# Patient Record
Sex: Female | Born: 1960 | Race: White | Hispanic: No | Smoking: Never smoker
Health system: Southern US, Community
[De-identification: ages and names within clinical notes are randomized; demographics above are authoritative.]

## PROBLEM LIST (undated history)

## (undated) DIAGNOSIS — E785 Hyperlipidemia, unspecified: Secondary | ICD-10-CM

## (undated) HISTORY — DX: Hyperlipidemia, unspecified: E78.5

---

## 1990-12-28 HISTORY — PX: CHOLECYSTECTOMY: SHX55

## 1993-12-28 HISTORY — PX: HERNIA REPAIR: SHX51

## 2004-10-27 ENCOUNTER — Ambulatory Visit: Payer: Self-pay | Admitting: Family Medicine

## 2010-10-02 ENCOUNTER — Inpatient Hospital Stay (HOSPITAL_COMMUNITY)
Admission: EM | Admit: 2010-10-02 | Discharge: 2010-10-05 | Payer: Self-pay | Source: Home / Self Care | Admitting: Emergency Medicine

## 2010-10-28 ENCOUNTER — Ambulatory Visit: Payer: Self-pay | Admitting: Family Medicine

## 2011-03-12 LAB — HEPATIC FUNCTION PANEL
ALT: 30 U/L (ref 0–35)
AST: 31 U/L (ref 0–37)
Bilirubin, Direct: 0.1 mg/dL (ref 0.0–0.3)
Total Bilirubin: 0.5 mg/dL (ref 0.3–1.2)

## 2011-03-12 LAB — CBC
HCT: 30.9 % — ABNORMAL LOW (ref 36.0–46.0)
HCT: 31.4 % — ABNORMAL LOW (ref 36.0–46.0)
HCT: 38.9 % (ref 36.0–46.0)
Hemoglobin: 10.1 g/dL — ABNORMAL LOW (ref 12.0–15.0)
Hemoglobin: 13.2 g/dL (ref 12.0–15.0)
MCH: 28.1 pg (ref 26.0–34.0)
MCHC: 32.7 g/dL (ref 30.0–36.0)
MCV: 82.2 fL (ref 78.0–100.0)
MCV: 84.6 fL (ref 78.0–100.0)
MCV: 85.8 fL (ref 78.0–100.0)
RBC: 3.71 MIL/uL — ABNORMAL LOW (ref 3.87–5.11)
RBC: 4.73 MIL/uL (ref 3.87–5.11)
WBC: 12.4 10*3/uL — ABNORMAL HIGH (ref 4.0–10.5)
WBC: 8 10*3/uL (ref 4.0–10.5)

## 2011-03-12 LAB — APTT: aPTT: 26 seconds (ref 24–37)

## 2011-03-12 LAB — DIFFERENTIAL
Basophils Relative: 0 % (ref 0–1)
Eosinophils Relative: 2 % (ref 0–5)
Lymphs Abs: 3.6 10*3/uL (ref 0.7–4.0)
Monocytes Relative: 4 % (ref 3–12)
Neutro Abs: 8.1 10*3/uL — ABNORMAL HIGH (ref 1.7–7.7)

## 2011-03-12 LAB — POCT I-STAT, CHEM 8
Chloride: 110 mEq/L (ref 96–112)
Creatinine, Ser: 0.9 mg/dL (ref 0.4–1.2)
Glucose, Bld: 154 mg/dL — ABNORMAL HIGH (ref 70–99)
Potassium: 3.2 mEq/L — ABNORMAL LOW (ref 3.5–5.1)
TCO2: 19 mmol/L (ref 0–100)

## 2011-03-12 LAB — URINALYSIS, ROUTINE W REFLEX MICROSCOPIC
Bilirubin Urine: NEGATIVE
Glucose, UA: NEGATIVE mg/dL
Hgb urine dipstick: NEGATIVE
Protein, ur: NEGATIVE mg/dL
Specific Gravity, Urine: 1.01 (ref 1.005–1.030)

## 2011-03-12 LAB — TYPE AND SCREEN: Antibody Screen: NEGATIVE

## 2011-03-12 LAB — ABO/RH: ABO/RH(D): O POS

## 2011-03-12 LAB — MRSA PCR SCREENING: MRSA by PCR: NEGATIVE

## 2011-03-12 LAB — BASIC METABOLIC PANEL
Chloride: 113 mEq/L — ABNORMAL HIGH (ref 96–112)
GFR calc Af Amer: >60 mL/min (ref >60)
Potassium: 3.9 mEq/L (ref 3.5–5.1)

## 2011-12-16 ENCOUNTER — Ambulatory Visit: Payer: Self-pay | Admitting: Family Medicine

## 2014-01-15 ENCOUNTER — Ambulatory Visit: Payer: Self-pay | Admitting: Family Medicine

## 2014-06-15 ENCOUNTER — Ambulatory Visit: Payer: Self-pay | Admitting: Gastroenterology

## 2014-06-18 LAB — PATHOLOGY REPORT

## 2017-08-17 NOTE — Progress Notes (Signed)
08/18/2017 12:18 PM   Samantha Tran 1961-07-06 [id number]  Referring provider: Jerl Mina, MD [address] [facility] [place], [place] [address]  Chief Complaint  Patient presents with  . New Patient (Initial Visit)    urinary incontinece referred by Dr. Delia Chimes     HPI Patient is a 56 -year-old Caucasian female who is referred to [place] by, Dr. Jerl Mina, for urinary incontinence.  Patient states that she has had urinary incontinence for four years.  Patient has incontinence with SUI, changing position and UI.  She is leakage all day long and all night long.  She has been doing Kegel exercises at home without benefit.  Tried timed voids q 20 minutes.    Her incontinence volume is moderate to large.   She is wearing two Poise pads/depends daily.    She is having associated urinary frequency 6-7, urgency and nocturia x 3-4.  She does not have a history of urinary tract infections, STI's or injury to the bladder.   She denies  dysuria, gross hematuria, suprapubic pain, back pain, abdominal pain or flank pain.  She has not had any recent fevers, chills, nausea or vomiting.   She has not had any recent fevers, chills, nausea or vomiting.   She does not have a history of nephrolithiasis, GU surgery or GU trauma.   She is post menopausal.   She denies constipation and/or diarrhea.   She has not had any recent imaging studies.    She is drinking several bottles of water daily.   She is drinking one caffeinated beverages daily.  She is not drinking alcoholic beverages daily.  Her PVR was 0 mL.    Reviewed referral notes.    PMH: No past medical history on file.  Surgical History: Past Surgical History:  Procedure Laterality Date  . CHOLECYSTECTOMY  1992  . HERNIA REPAIR  1995    Home Medications:  Allergies as of 08/18/2017      Reactions   Fluoxetine Other (See Comments)   "made me cry all the time" [Onset: 09/2010]   Oxycodone Other (See Comments)     N/V & Dizziness      Medication List       Accurate as of 08/18/17 11:59 PM. Always use your most recent med list.          sertraline 25 MG tablet Commonly known as:  ZOLOFT Take by mouth.   simvastatin 10 MG tablet Commonly known as:  ZOCOR Take by mouth.   topiramate 100 MG tablet Commonly known as:  TOPAMAX Take by mouth.            Discharge Care Instructions        Start     Ordered   08/18/17 0000  BLADDER SCAN AMB NON-IMAGING     08/18/17 1502   08/18/17 0000  Ambulatory referral to Physical Therapy    Comments:  Please evaluate for pelvic floor strengthening.   08/18/17 1604      Allergies:  Allergies  Allergen Reactions  . Fluoxetine Other (See Comments)    "made me cry all the time" [Onset: 09/2010]  . Oxycodone Other (See Comments)    N/V & Dizziness    Family History: Family History  Problem Relation Age of Onset  . Kidney cancer Neg Hx   . Bladder Cancer Neg Hx     Social History:  reports that she has never smoked. She has never used smokeless tobacco. She  reports that she does not drink alcohol or use drugs.  ROS: UROLOGY Frequent Urination?: Yes Hard to postpone urination?: Yes Burning/pain with urination?: No Get up at night to urinate?: Yes Leakage of urine?: Yes Urine stream starts and stops?: No Trouble starting stream?: No Do you have to strain to urinate?: No Blood in urine?: No Urinary tract infection?: No Sexually transmitted disease?: No Injury to kidneys or bladder?: No Painful intercourse?: No Weak stream?: No Currently pregnant?: No Vaginal bleeding?: No Last menstrual period?: n  Gastrointestinal Nausea?: No Vomiting?: No Indigestion/heartburn?: No Diarrhea?: No Constipation?: No  Constitutional Fever: No Night sweats?: No Weight loss?: No Fatigue?: No  Skin Skin rash/lesions?: No Itching?: No  Eyes Blurred vision?: No Double vision?: No  Ears/Nose/Throat Sore throat?: No Sinus  problems?: No  Hematologic/Lymphatic Swollen glands?: No Easy bruising?: No  Cardiovascular Leg swelling?: Yes Chest pain?: No  Respiratory Cough?: No Shortness of breath?: No  Endocrine Excessive thirst?: No  Musculoskeletal Back pain?: No Joint pain?: No  Neurological Headaches?: Yes Dizziness?: No  Psychologic Depression?: No Anxiety?: Yes  Physical Exam: BP 127/77   Pulse 89   Ht 5\' 1"  (1.549 m)   Wt 224 lb 14.4 oz (102 kg)   BMI 42.49 kg/m   Constitutional: Well nourished. Alert and oriented, No acute distress. HEENT: [place] AT, moist mucus membranes. Trachea midline, no masses. Cardiovascular: No clubbing, cyanosis, or edema. Respiratory: Normal respiratory effort, no increased work of breathing. GI: Abdomen is soft, non tender, non distended, no abdominal masses. Liver and spleen not palpable.  No hernias appreciated.  Stool sample for occult testing is not indicated.   GU: No CVA tenderness.  No bladder fullness or masses.  Atrophic external genitalia, normal pubic hair distribution, no lesions.  Normal urethral meatus, no lesions, no prolapse, no discharge.   No urethral masses, tenderness and/or tenderness. No bladder fullness, tenderness or masses. Normal vagina mucosa, good estrogen effect, no discharge, no lesions, good pelvic support, Grade I cystocele. No rectocele noted.  No cervical motion tenderness.  Uterus is freely mobile and non-fixed.  No adnexal/parametria masses or tenderness noted.  Anus and perineum are without rashes or lesions.    Skin: No rashes, bruises or suspicious lesions. Lymph: No cervical or inguinal adenopathy. Neurologic: Grossly intact, no focal deficits, moving all 4 extremities. Psychiatric: Normal mood and affect.  Laboratory Data: Creatinine 0.6 in 06/2017  I have reviewed the labs.   Pertinent Imaging: Results for MEL, MALCOM (MRN [id number]) as of 08/26/2017 12:21  Ref. Range 08/18/2017 15:10  Scan Result Unknown 0      Assessment & Plan:    1. Mixed Incontinence  - offered behavioral therapies, bladder training, bladder control strategies and pelvic floor muscle training - patient would like a referral to PT  - offered medical therapy with anticholinergic therapy or beta-3 adrenergic receptor agonist and the potential side effects of each therapy - given Myrbetriq 25 mg, # 28 samples; I have advised the patient of the side effects of Myrbetriq, such as: elevation in BP, urinary retention and/or HA.  - offered refer to gynecology for a pessary fitting - patient deferred  - RTC in 3 weeks for PVR and symptom recheck   2. Cystocele  - referred to PT   Return in about 3 weeks (around 09/08/2017) for PVR and OAB questionnaire.  These notes generated with voice recognition software. I apologize for typographical errors.  Michiel Cowboy, PA-C  [facility] [address]  [place], [place] [address] [phone]

## 2017-08-18 ENCOUNTER — Encounter: Payer: Self-pay | Admitting: Urology

## 2017-08-18 ENCOUNTER — Ambulatory Visit (INDEPENDENT_AMBULATORY_CARE_PROVIDER_SITE_OTHER): Payer: BC Managed Care – PPO | Admitting: Urology

## 2017-08-18 VITALS — BP 127/77 | HR 89 | Ht 61.0 in | Wt 224.9 lb

## 2017-08-18 DIAGNOSIS — N3946 Mixed incontinence: Secondary | ICD-10-CM

## 2017-08-18 DIAGNOSIS — N8111 Cystocele, midline: Secondary | ICD-10-CM

## 2017-08-18 DIAGNOSIS — N39498 Other specified urinary incontinence: Secondary | ICD-10-CM | POA: Diagnosis not present

## 2017-08-18 LAB — BLADDER SCAN AMB NON-IMAGING: Scan Result: 0

## 2017-09-08 ENCOUNTER — Ambulatory Visit (INDEPENDENT_AMBULATORY_CARE_PROVIDER_SITE_OTHER): Payer: BC Managed Care – PPO | Admitting: Urology

## 2017-09-08 ENCOUNTER — Encounter: Payer: Self-pay | Admitting: Urology

## 2017-09-08 ENCOUNTER — Telehealth: Payer: Self-pay | Admitting: Urology

## 2017-09-08 VITALS — BP 160/78 | HR 82 | Ht 61.0 in | Wt 225.7 lb

## 2017-09-08 DIAGNOSIS — N393 Stress incontinence (female) (male): Secondary | ICD-10-CM

## 2017-09-08 DIAGNOSIS — N8111 Cystocele, midline: Secondary | ICD-10-CM

## 2017-09-08 DIAGNOSIS — N3946 Mixed incontinence: Secondary | ICD-10-CM | POA: Diagnosis not present

## 2017-09-08 LAB — BLADDER SCAN AMB NON-IMAGING: Scan Result: 0

## 2017-09-08 NOTE — Progress Notes (Signed)
09/08/2017 2:31 PM   Samantha Tran 29-Sep-1961 [id number]  Referring provider: Jerl Mina, MD [address] [facility] [place], [place] [address]  Chief Complaint  Patient presents with  . Follow-up    3 weeks Stress Incontinence    HPI: 56 yo WF who presents today for a 3 week follow up after a trial of Myrbetriq 25 mg for incontinence.  Background history Patient is a 65 -year-old Caucasian female who is referred to [place] by, Dr. Jerl Mina, for urinary incontinence.  Patient states that she has had urinary incontinence for four years.  Patient has incontinence with SUI, changing position and UI.  She is leakage all day long and all night long.  She has been doing Kegel exercises at home without benefit.  Tried timed voids q 20 minutes.  Her incontinence volume is moderate to large.   She is wearing two Poise pads/depends daily.   She is having associated urinary frequency 6-7, urgency and nocturia x 3-4.  She does not have a history of urinary tract infections, STI's or injury to the bladder.  She denies  dysuria, gross hematuria, suprapubic pain, back pain, abdominal pain or flank pain.  She has not had any recent fevers, chills, nausea or vomiting.  She has not had any recent fevers, chills, nausea or vomiting.  She does not have a history of nephrolithiasis, GU surgery or GU trauma.  She is post menopausal.   She denies constipation and/or diarrhea.  She has not had any recent imaging studies.  She is drinking several bottles of water daily.   She is drinking one caffeinated beverages daily.  She is not drinking alcoholic beverages daily.  Her PVR was 0 mL.    Today, she is experiencing urgency x 0-3, frequency x 8 or more (worse), is restricting fluids to avoid visits to the restroom, is engaging in toilet mapping, incontinence x 8 or more and nocturia x 4-7 (worse).    She is not experiencing dysuria, gross hematuria or suprapubic pain. She is not having fevers,  chills, nausea or vomiting.   She states the Myrbetriq made a difference.  She has notice less urgency.  PVR is 0 mL.       PMH: No past medical history on file.  Surgical History: Past Surgical History:  Procedure Laterality Date  . CHOLECYSTECTOMY  1992  . HERNIA REPAIR  1995    Home Medications:  Allergies as of 09/08/2017      Reactions   Fluoxetine Other (See Comments)   "made me cry all the time" [Onset: 09/2010]   Oxycodone Other (See Comments)   N/V & Dizziness      Medication List       Accurate as of 09/08/17 11:59 PM. Always use your most recent med list.          chlorpheniramine-HYDROcodone 10-8 MG/5ML Suer Commonly known as:  TUSSIONEX Take by mouth.   levofloxacin 500 MG tablet Commonly known as:  LEVAQUIN Take by mouth.   sertraline 25 MG tablet Commonly known as:  ZOLOFT Take by mouth.   simvastatin 10 MG tablet Commonly known as:  ZOCOR Take by mouth.   topiramate 100 MG tablet Commonly known as:  TOPAMAX Take by mouth.            Discharge Care Instructions        Start     Ordered   09/08/17 0000  BLADDER SCAN AMB NON-IMAGING  09/08/17 1538      Allergies:  Allergies  Allergen Reactions  . Fluoxetine Other (See Comments)    "made me cry all the time" [Onset: 09/2010]  . Oxycodone Other (See Comments)    N/V & Dizziness    Family History: Family History  Problem Relation Age of Onset  . Kidney cancer Neg Hx   . Bladder Cancer Neg Hx     Social History:  reports that she has never smoked. She has never used smokeless tobacco. She reports that she does not drink alcohol or use drugs.  ROS: UROLOGY Frequent Urination?: No Hard to postpone urination?: No Burning/pain with urination?: No Get up at night to urinate?: Yes Leakage of urine?: Yes Urine stream starts and stops?: No Trouble starting stream?: No Do you have to strain to urinate?: No Blood in urine?: No Urinary tract infection?: No Sexually  transmitted disease?: No Injury to kidneys or bladder?: No Painful intercourse?: No Weak stream?: No Currently pregnant?: No Vaginal bleeding?: No Last menstrual period?: n  Gastrointestinal Nausea?: No Vomiting?: No Indigestion/heartburn?: No Diarrhea?: No Constipation?: No  Constitutional Fever: No Night sweats?: No Weight loss?: No Fatigue?: No  Skin Skin rash/lesions?: No Itching?: No  Eyes Blurred vision?: No Double vision?: No  Ears/Nose/Throat Sore throat?: No Sinus problems?: No  Hematologic/Lymphatic Swollen glands?: No Easy bruising?: No  Cardiovascular Leg swelling?: No Chest pain?: No  Respiratory Cough?: Yes Shortness of breath?: Yes  Endocrine Excessive thirst?: No  Musculoskeletal Back pain?: No Joint pain?: No  Neurological Headaches?: No Dizziness?: No  Psychologic Depression?: No Anxiety?: No  Physical Exam: BP (!) 160/78   Pulse 82   Ht 5\' 1"  (1.549 m)   Wt 225 lb 11.2 oz (102.4 kg)   BMI 42.65 kg/m   Constitutional: Well nourished. Alert and oriented, No acute distress. HEENT: [place] AT, moist mucus membranes. Trachea midline, no masses. Cardiovascular: No clubbing, cyanosis, or edema. Respiratory: Normal respiratory effort, no increased work of breathing. Skin: No rashes, bruises or suspicious lesions. Lymph: No cervical or inguinal adenopathy. Neurologic: Grossly intact, no focal deficits, moving all 4 extremities. Psychiatric: Normal mood and affect.  Laboratory Data: Creatinine 0.6 in 06/2017  I have reviewed the labs.   Pertinent Imaging: Results for Samantha Tran, Lattie H (MRN [id number]) as of 09/10/2017 14:33  Ref. Range 09/08/2017 15:50  Scan Result Unknown 0      Assessment & Plan:    1. Mixed Incontinence  - offered behavioral therapies, bladder training, bladder control strategies and pelvic floor muscle training - patient would like a referral to PT - has an appointment on 10/13/2017  - would like to  increase the Myrbetriq 50 mg to see if it is more effective   - RTC in 3 weeks for PVR and OAB questionnaire  2. Cystocele  - referred to PT   Return in about 3 weeks (around 09/29/2017) for PVR and OAB questionnaire.  These notes generated with voice recognition software. I apologize for typographical errors.  Michiel CowboySHANNON Karie Skowron, PA-C  [facility] [address], [address] [place], [place] [address] [phone]

## 2017-09-08 NOTE — Telephone Encounter (Signed)
Please send my note to Dr. Hedrick 

## 2017-09-13 NOTE — Telephone Encounter (Signed)
Notes faxed ° °Samantha Tran ° °

## 2017-09-27 NOTE — Progress Notes (Signed)
09/29/2017 6:34 PM   Bary Richard [birth year] [id number]  Referring provider: Jerl Mina, MD [address] [facility] [place], [place] [address]  Chief Complaint  Patient presents with  . Follow-up    3 weeks mixed incoontinence / Cystocele    HPI: 56 yo WF who presents today for a 3 week follow up after a trial of Myrbetriq 50 mg for incontinence.  Background history Patient is a 49 -year-old Caucasian female who is referred to [place] by, Dr. Jerl Mina, for urinary incontinence.  Patient states that she has had urinary incontinence for four years.  Patient has incontinence with SUI, changing position and UI.  She is leakage all day long and all night long.  She has been doing Kegel exercises at home without benefit.  Tried timed voids q 20 minutes.  Her incontinence volume is moderate to large.   She is wearing two Poise pads/depends daily.   She is having associated urinary frequency 6-7, urgency and nocturia x 3-4.  She does not have a history of urinary tract infections, STI's or injury to the bladder.  She denies  dysuria, gross hematuria, suprapubic pain, back pain, abdominal pain or flank pain.  She has not had any recent fevers, chills, nausea or vomiting.  She has not had any recent fevers, chills, nausea or vomiting.  She does not have a history of nephrolithiasis, GU surgery or GU trauma.  She is post menopausal.   She denies constipation and/or diarrhea.  She has not had any recent imaging studies.  She is drinking several bottles of water daily.   She is drinking one caffeinated beverages daily.  She is not drinking alcoholic beverages daily.  Her PVR was 0 mL.    At her visit three weeks ago, we increased her Myrbetriq from 25 mg to 50 mg daily.    Today, she is experiencing urgency x 8 or more (worse), frequency x 8 or more (stable), is not restricting fluids to avoid visits to the restroom, is engaging in toilet mapping, incontinence x 8 or more (stable) and  nocturia x 4-7 (stable).    She is not experiencing dysuria, gross hematuria or suprapubic pain. She is not having fevers, chills, nausea or vomiting.   She states the Myrbetriq "wore off".   PVR is 0 mL.  BP is 132/79.      She has her appointment scheduled for PT and a couple weeks. I suggested that she get the POISE OTC pessary in the meantime.  I also gave her Toviaz 8 mg daily samples #28.       PMH: Past Medical History:  Diagnosis Date  . HLD (hyperlipidemia)     Surgical History: Past Surgical History:  Procedure Laterality Date  . CHOLECYSTECTOMY  1992  . HERNIA REPAIR  1995    Home Medications:  Allergies as of 09/29/2017      Reactions   Fluoxetine Other (See Comments)   "made me cry all the time" [Onset: 09/2010]   Oxycodone Other (See Comments)   N/V & Dizziness      Medication List       Accurate as of 09/29/17 11:59 PM. Always use your most recent med list.          chlorpheniramine-HYDROcodone 10-8 MG/5ML Suer Commonly known as:  TUSSIONEX Take by mouth.   levofloxacin 500 MG tablet Commonly known as:  LEVAQUIN Take by mouth.   MYRBETRIQ 50 MG Tb24 tablet Generic drug:  mirabegron ER Take 50 mg by mouth daily.   sertraline 25 MG tablet Commonly known as:  ZOLOFT Take by mouth.   simvastatin 10 MG tablet Commonly known as:  ZOCOR Take by mouth.   topiramate 100 MG tablet Commonly known as:  TOPAMAX Take by mouth.       Allergies:  Allergies  Allergen Reactions  . Fluoxetine Other (See Comments)    "made me cry all the time" [Onset: 09/2010]  . Oxycodone Other (See Comments)    N/V & Dizziness    Family History: Family History  Problem Relation Age of Onset  . Kidney cancer Neg Hx   . Bladder Cancer Neg Hx     Social History:  reports that she has never smoked. She has never used smokeless tobacco. She reports that she does not drink alcohol or use drugs.  ROS: UROLOGY Frequent Urination?: Yes Hard to postpone urination?:  No Burning/pain with urination?: No Get up at night to urinate?: Yes Leakage of urine?: Yes Urine stream starts and stops?: No Trouble starting stream?: No Do you have to strain to urinate?: No Blood in urine?: No Urinary tract infection?: No Sexually transmitted disease?: No Injury to kidneys or bladder?: No Painful intercourse?: No Weak stream?: No Currently pregnant?: No Vaginal bleeding?: No Last menstrual period?: n  Gastrointestinal Nausea?: No Vomiting?: No Indigestion/heartburn?: No Diarrhea?: No Constipation?: No  Constitutional Fever: No Night sweats?: No Weight loss?: No Fatigue?: No  Skin Skin rash/lesions?: No Itching?: No  Eyes Blurred vision?: No Double vision?: No  Ears/Nose/Throat Sore throat?: No Sinus problems?: No  Hematologic/Lymphatic Swollen glands?: No Easy bruising?: No  Cardiovascular Leg swelling?: No Chest pain?: No  Respiratory Cough?: No Shortness of breath?: No  Endocrine Excessive thirst?: No  Musculoskeletal Back pain?: No Joint pain?: No  Neurological Headaches?: No Dizziness?: No  Psychologic Depression?: No Anxiety?: No  Physical Exam: BP 132/79   Pulse 80   Ht  (1.549 m)   Wt 227 lb 12.8 oz (103.3 kg)   BMI 43.04 kg/m   Constitutional: Well nourished. Alert and oriented, No acute distress. HEENT: [place] AT, moist mucus membranes. Trachea midline, no masses. Cardiovascular: No clubbing, cyanosis, or edema. Respiratory: Normal respiratory effort, no increased work of breathing. Skin: No rashes, bruises or suspicious lesions. Lymph: No cervical or inguinal adenopathy. Neurologic: Grossly intact, no focal deficits, moving all 4 extremities. Psychiatric: Normal mood and affect.  Laboratory Data: Creatinine 0.6 in 06/2017  I have reviewed the labs.   Pertinent Imaging: Results for JAZZ, BIDDY (MRN [id number]) as of 09/29/2017 16:01  Ref. Range 09/29/2017 15:56  Scan Result Unknown 14     Assessment & Plan:    1. Mixed Incontinence  - offered behavioral therapies, bladder training, bladder control strategies and pelvic floor muscle training - patient would like a referral to PT - has an appointment on 10/13/2017  - failed Myrbetriq 50  - given Toviaz sample  - RTC after completes PT  2. Cystocele  - referred to PT  - try OTC pessary   Return for patient to call when she completes PT.  These notes generated with voice recognition software. I apologize for typographical errors.  Michiel Cowboy, PA-C  [facility] [address] [place], [place] [address] [phone]

## 2017-09-29 ENCOUNTER — Encounter: Payer: Self-pay | Admitting: Urology

## 2017-09-29 ENCOUNTER — Ambulatory Visit (INDEPENDENT_AMBULATORY_CARE_PROVIDER_SITE_OTHER): Payer: BC Managed Care – PPO | Admitting: Urology

## 2017-09-29 VITALS — BP 132/79 | HR 80 | Ht 61.0 in | Wt 227.8 lb

## 2017-09-29 DIAGNOSIS — N3946 Mixed incontinence: Secondary | ICD-10-CM

## 2017-09-29 DIAGNOSIS — E785 Hyperlipidemia, unspecified: Secondary | ICD-10-CM | POA: Insufficient documentation

## 2017-09-29 DIAGNOSIS — N8111 Cystocele, midline: Secondary | ICD-10-CM

## 2017-09-29 LAB — BLADDER SCAN AMB NON-IMAGING: SCAN RESULT: 14

## 2017-10-08 ENCOUNTER — Encounter: Payer: Self-pay | Admitting: Urology

## 2017-10-13 ENCOUNTER — Ambulatory Visit: Payer: BC Managed Care – PPO | Attending: Urology | Admitting: Physical Therapy

## 2017-10-13 ENCOUNTER — Encounter: Payer: Self-pay | Admitting: Physical Therapy

## 2017-10-13 DIAGNOSIS — R29898 Other symptoms and signs involving the musculoskeletal system: Secondary | ICD-10-CM | POA: Diagnosis present

## 2017-10-13 DIAGNOSIS — R278 Other lack of coordination: Secondary | ICD-10-CM | POA: Insufficient documentation

## 2017-10-13 DIAGNOSIS — M791 Myalgia, unspecified site: Secondary | ICD-10-CM

## 2017-10-13 NOTE — Patient Instructions (Signed)
Handout on   Deep core 1-2  Body mechanics with household chores and lifting

## 2017-10-15 NOTE — Therapy (Signed)
[facility] MAIN [facility] SERVICES [address] [place], [place], [address] Phone: [phone]   Fax:  [phone]  Physical Therapy Evaluation  Patient Details  Name: Samantha Tran MRN: [id number] Date of Birth: [birth year] Referring Provider: Michiel Cowboy   Encounter Date: 10/13/2017    Past Medical History:  Diagnosis Date  . HLD (hyperlipidemia)     Past Surgical History:  Procedure Laterality Date  . CHOLECYSTECTOMY  1992  . HERNIA REPAIR  1995   below belly button    There were no vitals filed for this visit.       Subjective Assessment - 10/15/17 0910    Subjective Pt reported one year ago, pt experienced leaking with coughing, sneezing, and laughing, leaning forward to sit up out of chair, standing up from a chair, out of bed. Pt has noticed it has gotten worse to the point of leakage constantly. Pt wears 2-3 pads / day.  denied fecal incontinence, pelvic pain, nor LBP.  Nocturia: 3-4x/ night which has also increased over the past year. Prior to this, pt was able to sleep all night without getting up. Pt has leakage even though she just went. Pt's urologist told her that she is not holding urine but it is just there. Occupation now as Science writer:  lifting  60 lb, standing for long periods, bending, pulling . 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 Umbilical hernia repair. Pt used to lift repeatedly 60 lb at her previous job.     Patient Stated Goals to strengthen             [facility] PT Assessment - 10/15/17 0911      Assessment   Medical Diagnosis SUI   Referring Provider Michiel Cowboy      Precautions   Precautions None     Restrictions   Weight Bearing Restrictions No     Balance Screen   Has the patient fallen in the past 6 months No     Coordination   Gross Motor Movements are Fluid and  Coordinated --  dyscoordination of pelvic floor mm, abdominal strain     Squat   Comments narrow BOS, knees over ankles      Other:   Other/ Comments simulated tasks: poor alignment and biomechanics housecleaning, vaccuuming, lifting and turning      Palpation   Spinal mobility increased spinl and upper trap mm tightness             Objective measurements completed on examination: See above findings.        Pelvic Floor Special Questions - 10/15/17 0931    Diastasis Recti neg    Pelvic Floor Internal Exam pt was explained details to the exam and pt consented verbally without contraindications    Exam Type Vaginal   Palpation increased tensions at levator ani B    Strength fair squeeze, definite lift          [facility] Adult PT Treatment/Exercise - 10/15/17 0932      Neuro Re-ed    Neuro Re-ed Details  see pt instructions     Manual Therapy   Internal Pelvic Floor thiele massage, STM at B levator ani                      PT Long Term Goals - 10/13/17 1816  PT LONG TERM GOAL #1   Title Pt will decrease her score on UDI-6 from54% to < 49% in order to improve pelvic floor function   Time 12   Period Weeks   Status New   Target Date 01/05/18     PT LONG TERM GOAL #2   Title Pt will report no leakage with getting up from the chair in order to particiapte in ADLs   Time 8   Period Weeks   Status New   Target Date 12/08/17     PT LONG TERM GOAL #3   Title Pt will demo no pelvic floor mm tensions across 2 visits in order to achieve proper pelvic floor lengthening / coordination in order to gain continence    Time 4   Period Weeks   Status New   Target Date 11/10/17     PT LONG TERM GOAL #4   Title Pt will demo IND with HEP   Time 10   Period Weeks   Status New   Target Date 10/13/17                Plan - 10/15/17 0957    Clinical Impression Statement Pt is a 56 yo female who c/o constant urinary leakage, nocturia frequency, and  incomplete emptying. These deficits impact her QOL and ADLs. Pt 's clinical presentations include increased spinal and upper trap tensions, poor body mechanics with functional activities, and overactivity and dyscoordination of pelvic floor mm. Following Tx today, pt demo'dincreased relaxation/ lengthening of pelvic floor mm with proper breathing coordination with pelvic floor. Pt also showed improved body mechanics for work related tasks to minimize strain on pelvic floor mm.      Clinical Presentation Evolving   Clinical Decision Making Low   Rehab Potential Good   PT Frequency 1x / week   PT Duration 12 weeks   PT Treatment/Interventions Therapeutic activities;Therapeutic exercise;Functional mobility training;Neuromuscular re-education;Manual techniques;Cryotherapy;Moist Heat;Splinting;Taping;Scar mobilization;Gait training;Stair training;Patient/family education   Consulted and Agree with Plan of Care Patient      Patient will benefit from skilled therapeutic intervention in order to improve the following deficits and impairments:  Postural dysfunction, Increased muscle spasms, Hypermobility, Improper body mechanics, Decreased coordination, Decreased mobility, Decreased safety awareness, Difficulty walking, Decreased endurance, Decreased activity tolerance, Decreased range of motion, Decreased strength, Hypomobility  Visit Diagnosis: Myalgia  Other lack of coordination  Other symptoms and signs involving the musculoskeletal system     Problem List Patient Active Problem List   Diagnosis Date Noted  . Hyperlipidemia, unspecified 09/29/2017    Mariane MastersYeung,Shin Yiing ,PT, DPT, E-RYT  10/15/2017, 9:58 AM  [facility] [address] [place], [place], [address] Phone: [phone]   Fax:  [phone]  Name: Samantha Tran MRN: [id number] Date of Birth: 11/21/1961

## 2017-10-19 ENCOUNTER — Ambulatory Visit: Payer: BC Managed Care – PPO | Admitting: Physical Therapy

## 2017-10-19 DIAGNOSIS — M791 Myalgia, unspecified site: Secondary | ICD-10-CM

## 2017-10-19 DIAGNOSIS — R29898 Other symptoms and signs involving the musculoskeletal system: Secondary | ICD-10-CM

## 2017-10-19 DIAGNOSIS — R278 Other lack of coordination: Secondary | ICD-10-CM

## 2017-10-19 NOTE — Therapy (Addendum)
[place] MAIN [facility] SERVICES [address] [place], [place], [address] Phone: [phone]   Fax:  [phone]  Physical Therapy Treatment  Patient Details  Name: Samantha Tran MRN: [id number] Date of Birth: 11-Feb-1961 Referring Provider: Zara Council   Encounter Date: 10/19/2017      PT End of Session - 10/19/17 1649    Visit Number 2   Number of Visits 12   Date for PT Re-Evaluation 01/05/18   PT Start Time 1600   PT Stop Time 1655   PT Time Calculation (min) 55 min   Activity Tolerance Patient tolerated treatment well;No increased pain   Behavior During Therapy WFL for tasks assessed/performed      Past Medical History:  Diagnosis Date  . HLD (hyperlipidemia)     Past Surgical History:  Procedure Laterality Date  . CHOLECYSTECTOMY  1992  . HERNIA REPAIR  1995   below belly button    There were no vitals filed for this visit.      Subjective Assessment - 10/19/17 1604    Subjective Pt reported her leakage is substantially better and she is waking up 2x/ night instead of 4x/ night.  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    Pertinent History 1 vaginal deliveries with LBP and excessive pushing. Umbilical hernia repair. Pt used to lift repeatedly 60 lb at her previous job.     Patient Stated Goals to strengthen             [facility] PT Assessment - 10/19/17 1639      Coordination   Gross Motor Movements are Fluid and Coordinated --  limited diaphragmatic exxcursion ( bra strap too tight)     Palpation   Spinal mobility significant tensions B upper trap and paraspinals at thoracic, hypomobility at T7 ( improved post Tx)                   Pelvic Floor Special Questions - 10/19/17 1638    Diastasis Recti neg    Pelvic Floor Internal Exam pt was explained details to the exam and pt consented verbally without contraindications    Exam Type Vaginal   Palpation no tensions at  levator ani B    Strength fair squeeze, definite lift  abdominal overuse           [facility] Adult PT Treatment/Exercise - 10/19/17 1640      Moist Heat Therapy   Number Minutes Moist Heat --  10 min with treatment    Moist Heat Location --  back     Manual Therapy   Manual therapy comments STM and MWM at paraspinals mm,  Grade III mob at T7    Internal Pelvic Floor facilitation of urogenital mm                 PT Education - 10/19/17 1648    Education provided Yes   Education Details HEP   Person(s) Educated Patient   Methods Explanation;Demonstration;Tactile cues;Verbal cues;Handout   Comprehension Returned demonstration;Verbalized understanding             PT Long Term Goals - 10/19/17 1606      PT LONG TERM GOAL #1   Title Pt will decrease her score on UDI-6 from54% to < 49% in order to improve pelvic floor function   Time 12   Period Weeks   Status On-going     PT LONG TERM GOAL #2  Title Pt will report no leakage with getting up from the chair in order to particiapte in ADLs   Time 8   Period Weeks   Status Partially Met     PT LONG TERM GOAL #3   Title Pt will demo no pelvic floor mm tensions across 2 visits in order to achieve proper pelvic floor lengthening / coordination in order to gain continence    Time 4   Period Weeks   Status Achieved     PT LONG TERM GOAL #4   Title Pt will demo IND with HEP   Time 10   Period Weeks   Status On-going     PT LONG TERM GOAL #5   Title Pt will demo proper pelvic floor ROM and closure with proper diaphragmatic excursion in order to minimize leakage   Time 12   Period Weeks   Status New     Additional Long Term Goals   Additional Long Term Goals Yes     PT LONG TERM GOAL #6   Title PT will demo decreased paraspinal mm tensions at thoracic spine in order to restore spinal and pelvic floor ROM for actitivities at home and work   Time 12   Period Weeks   Status New               Plan  - 10/19/17 1643    Clinical Impression Statement Pt is progressing well with report of decreased nocturia from 4x/ night to 2x/ night.  Pt demo'd no pelvic floor tensions B compared to last session. Pt required manual Tx to minimize thoracic mm tensions to elicit optimal diaphragmatic excursion and associated improved pelvic floor ROM.  Pt was also educated to don bra at a lower level and to use a bra extensor to provide room for lateral excursion of ribcage. Pt was also educated on mindfulness technique to induce relaxation of muscles. Pt reported feeling "peaceful" and demo'd less mm back tensions following Tx. PT continues to benefit from skilled PT.     Rehab Potential Good   PT Frequency 1x / week   PT Duration 12 weeks   PT Treatment/Interventions Therapeutic activities;Therapeutic exercise;Functional mobility training;Neuromuscular re-education;Manual techniques;Cryotherapy;Moist Heat;Splinting;Taping;Scar mobilization;Gait training;Stair training;Patient/family education   Consulted and Agree with Plan of Care Patient      Patient will benefit from skilled therapeutic intervention in order to improve the following deficits and impairments:  Postural dysfunction, Increased muscle spasms, Hypermobility, Improper body mechanics, Decreased coordination, Decreased mobility, Decreased safety awareness, Difficulty walking, Decreased endurance, Decreased activity tolerance, Decreased range of motion, Decreased strength, Hypomobility  Visit Diagnosis: Other lack of coordination  Other symptoms and signs involving the musculoskeletal system  Myalgia     Problem List Patient Active Problem List   Diagnosis Date Noted  . Hyperlipidemia, unspecified 09/29/2017    Jerl Mina ,PT, DPT, E-RYT  10/19/2017, 4:58 PM  [place] MAIN [facility] SERVICES [address] [place], [place], [address] Phone: [phone]   Fax:  [phone]  Name: Samantha Tran MRN: [id number] Date of Birth: 1961-11-17

## 2017-10-19 NOTE — Patient Instructions (Addendum)
Open book ( handout)   Deep core level 1-2 ( handout)   Body scan ( mindfulness technique)   To practice when coming home from work to decompress and relax muscles    Wear bra lower ( 1 inch above elbow) and fasten an extensor to provide more romm for ribcage expansion when breathing . This will help also to relax pelvic floor

## 2017-10-27 ENCOUNTER — Ambulatory Visit: Payer: BC Managed Care – PPO | Admitting: Physical Therapy

## 2017-10-27 DIAGNOSIS — M791 Myalgia, unspecified site: Secondary | ICD-10-CM | POA: Diagnosis not present

## 2017-10-27 DIAGNOSIS — R29898 Other symptoms and signs involving the musculoskeletal system: Secondary | ICD-10-CM

## 2017-10-27 DIAGNOSIS — R278 Other lack of coordination: Secondary | ICD-10-CM

## 2017-10-27 NOTE — Patient Instructions (Signed)
Inhale , relax pelvic floor and shoulders  Then lift       To release pelvic floor in standing position   _Figure-4 and then toe touch to the ground behind you along a diagonal

## 2017-10-27 NOTE — Therapy (Signed)
[place] MAIN [facility] SERVICES [address] [place], [place], [address] Phone: [phone]   Fax:  [phone]  Physical Therapy Treatment  Patient Details  Name: Samantha Tran MRN: [id number] Date of Birth: 08-Jul-1961 Referring Provider: Zara Council   Encounter Date: 10/27/2017      PT End of Session - 10/27/17 1617    Visit Number 3   Number of Visits 12   Date for PT Re-Evaluation 01/05/18   PT Start Time 1610   PT Stop Time 1700   PT Time Calculation (min) 50 min   Activity Tolerance Patient tolerated treatment well;No increased pain   Behavior During Therapy WFL for tasks assessed/performed      Past Medical History:  Diagnosis Date  . HLD (hyperlipidemia)     Past Surgical History:  Procedure Laterality Date  . CHOLECYSTECTOMY  1992  . HERNIA REPAIR  1995   below belly button    There were no vitals filed for this visit.      Subjective Assessment - 10/27/17 1616    Subjective pt noticed the triggers for urge: stress at work, unloading the trucks. Pt notices her legs are getting tighter with the exercises. pt has not had leakage with standing up.    Pertinent History 1 vaginal deliveries with LBP and excessive pushing. Umbilical hernia repair. Pt used to lift repeatedly 60 lb at her previous job.     Patient Stated Goals to strengthen                       Pelvic Floor Special Questions - 10/27/17 1641    Diastasis Recti neg    Pelvic Floor Internal Exam pt was explained details to the exam and pt consented verbally without contraindications    Exam Type Vaginal   Palpation tensions at 3rd layer B obt int/ iliococcygeus     Strength fair squeeze, definite lift  adductor overuse. ( improved lengthening post Tx0       Assessment: functional activity: upper trap overuse with inhalation when lifting        [facility] Adult PT Treatment/Exercise - 10/27/17 1641      Moist Heat Therapy   Moist  Heat Location --  back, perineal with pillow cases/towel. 6 min w/ exercise      Manual Therapy   Internal Pelvic Floor thiele massage at problem areas noted in assessment  B       Practiced lifting chair with proper breathing coordination to relax pelvicfloor           PT Education - 10/27/17 1640    Education provided Yes   Education Details HEP   Person(s) Educated Patient   Methods Explanation;Demonstration;Tactile cues;Verbal cues;Handout   Comprehension Returned demonstration;Verbalized understanding             PT Long Term Goals - 10/27/17 1621      PT LONG TERM GOAL #1   Title Pt will decrease her score on UDI-6 from54% to < 49% in order to improve pelvic floor function   Time 12   Period Weeks   Status On-going     PT LONG TERM GOAL #2   Title Pt will report no leakage with getting up from the chair in order to particiapte in ADLs   Time 8   Period Weeks   Status Partially Met     PT LONG TERM GOAL #3   Title Pt will demo no pelvic floor  mm tensions across 2 visits in order to achieve proper pelvic floor lengthening / coordination in order to gain continence    Time 4   Period Weeks   Status Achieved     PT LONG TERM GOAL #4   Title Pt will demo IND with HEP   Time 10   Period Weeks   Status On-going     PT LONG TERM GOAL #5   Title Pt will demo proper pelvic floor ROM and closure with proper diaphragmatic excursion in order to minimize leakage   Time 12   Period Weeks   Status On-going     PT LONG TERM GOAL #6   Title PT will demo decreased paraspinal mm tensions at thoracic spine in order to restore spinal and pelvic floor ROM for actitivities at home and work   Time 12   Period Weeks   Status Partially Met               Plan - 10/27/17 1642    Clinical Impression Statement Pt is progressing well with no more lekaage with sit to stand. Pt demo'd diminished pelvic floor mm tensions at deep layer today. Pt showed improved pelvic  floor lengthening. Discussed relaxation practices to minimze stress and overactivity of pelvic floor.  Initiated mini squat and lifting with proper pelvic floor lengthening. Pt demo'd proper coordination post Tx.  Pt continues to benefit from skilled PT    Rehab Potential Good   PT Frequency 1x / week   PT Duration 12 weeks   PT Treatment/Interventions Therapeutic activities;Therapeutic exercise;Functional mobility training;Neuromuscular re-education;Manual techniques;Cryotherapy;Moist Heat;Splinting;Taping;Scar mobilization;Gait training;Stair training;Patient/family education   Consulted and Agree with Plan of Care Patient      Patient will benefit from skilled therapeutic intervention in order to improve the following deficits and impairments:  Postural dysfunction, Increased muscle spasms, Hypermobility, Improper body mechanics, Decreased coordination, Decreased mobility, Decreased safety awareness, Difficulty walking, Decreased endurance, Decreased activity tolerance, Decreased range of motion, Decreased strength, Hypomobility  Visit Diagnosis: Other lack of coordination  Other symptoms and signs involving the musculoskeletal system  Myalgia     Problem List Patient Active Problem List   Diagnosis Date Noted  . Hyperlipidemia, unspecified 09/29/2017    Jerl Mina ,PT, DPT, E-RYT  10/27/2017, 4:52 PM  [place] MAIN [facility] SERVICES [address] [place], [place], [address] Phone: [phone]   Fax:  [phone]  Name: Samantha Tran MRN: [id number] Date of Birth: 14-Feb-1961

## 2017-11-02 ENCOUNTER — Ambulatory Visit: Payer: BC Managed Care – PPO | Attending: Urology | Admitting: Physical Therapy

## 2017-11-02 DIAGNOSIS — M791 Myalgia, unspecified site: Secondary | ICD-10-CM | POA: Diagnosis present

## 2017-11-02 DIAGNOSIS — R29898 Other symptoms and signs involving the musculoskeletal system: Secondary | ICD-10-CM | POA: Diagnosis present

## 2017-11-02 DIAGNOSIS — R278 Other lack of coordination: Secondary | ICD-10-CM | POA: Insufficient documentation

## 2017-11-02 NOTE — Patient Instructions (Addendum)
Seated:  Paced breathing for relaxation 1:1 pauses in between  2:2 with pauses in between inhale and exhale  X 3 reps   2 inhale: 4 count exhale with between inhale and exhale  X 3 reps   _______    band under heels while laying on back w/ knees bent  "W" exercise  10 reps x 2 sets   Band is placed under feet, knees bent, feet are hip width apart Hold band with thumbs point out, keep upper arm and elbow touching the bed the whole time  - inhale and then exhale pull bands by bending elbows hands move in a "w"  (feel shoulder blades squeezing)    ________________  Multifidis twist  Band is on doorknob: stand further away from door (facing perpendicular)   Twisting trunk without moving the hips and knees Hold band at between the ear and shoulder height   Exhale twist,.10-15 deg away from door without moving your hips/ knees. Continue to maintain equal weight through legs. Keep knee unlocked.  10 x 2   ** more weight into feet, spread toes  _______________  Band at feet minisquat and then exhale on rise and pull band like bicpe curl   10 x 2  ** more weight into feet, spread toes

## 2017-11-02 NOTE — Therapy (Signed)
[place] MAIN [facility] SERVICES [address] [place], [place], [address] Phone: [phone]   Fax:  [phone]  Physical Therapy Treatment  Patient Details  Name: Samantha Tran MRN: [id number] Date of Birth: 06/30/1961 Referring Provider: Zara Council    Encounter Date: 11/02/2017  PT End of Session - 11/02/17 1644    Visit Number  4    Number of Visits  12    Date for PT Re-Evaluation  01/05/18    PT Start Time  4709    PT Stop Time  6283    PT Time Calculation (min)  46 min    Activity Tolerance  Patient tolerated treatment well;No increased pain    Behavior During Therapy  WFL for tasks assessed/performed       Past Medical History:  Diagnosis Date  . HLD (hyperlipidemia)     Past Surgical History:  Procedure Laterality Date  . CHOLECYSTECTOMY  1992  . HERNIA REPAIR  1995   below belly button    There were no vitals filed for this visit.  Subjective Assessment - 11/02/17 1612    Subjective  Pt notices she leaks more when she stressed.  Her leakage has not been bad last week.     Pertinent History  1 vaginal deliveries with LBP and excessive pushing. Umbilical hernia repair. Pt used to lift repeatedly 60 lb at her previous job.      Patient Stated Goals  to strengthen                    Pelvic Floor Special Questions - 11/02/17 1632    Diastasis Recti  neg     Pelvic Floor Internal Exam  pt was explained details to the exam and pt consented verbally without contraindications     Exam Type  Vaginal    Palpation  no tensions     Strength  fair squeeze, definite lift no adductor overuse.    no adductor overuse.        [facility] Adult PT Treatment/Exercise - 11/02/17 1632      Exercises   Exercises  -- see pt instructions   see pt instructions            PT Education - 11/02/17 1638    Education provided  Yes    Education Details  HEP    Person(s) Educated  Patient    Methods   Explanation;Demonstration;Tactile cues;Verbal cues;Handout    Comprehension  Returned demonstration;Verbalized understanding          PT Long Term Goals - 10/27/17 1621      PT LONG TERM GOAL #1   Title  Pt will decrease her score on UDI-6 from54% to < 49% in order to improve pelvic floor function    Time  12    Period  Weeks    Status  On-going      PT LONG TERM GOAL #2   Title  Pt will report no leakage with getting up from the chair in order to particiapte in ADLs    Time  8    Period  Weeks    Status  Partially Met      PT LONG TERM GOAL #3   Title  Pt will demo no pelvic floor mm tensions across 2 visits in order to achieve proper pelvic floor lengthening / coordination in order to gain continence     Time  4    Period  Weeks  Status  Achieved      PT LONG TERM GOAL #4   Title  Pt will demo IND with HEP    Time  10    Period  Weeks    Status  On-going      PT LONG TERM GOAL #5   Title  Pt will demo proper pelvic floor ROM and closure with proper diaphragmatic excursion in order to minimize leakage    Time  12    Period  Weeks    Status  On-going      PT LONG TERM GOAL #6   Title  PT will demo decreased paraspinal mm tensions at thoracic spine in order to restore spinal and pelvic floor ROM for actitivities at home and work    Time  12    Period  Weeks    Status  Partially Met            Plan - 11/02/17 1642    Clinical Impression Statement  Pt continues to progress well towards her goals. Pt presented today with no pelvic floor mm tensions which indicates good carry voer with HEP and previous Tx. Pt advanced to thoracolumabar/ glut and multifidis strengthening to help pt minimize pelvic floor overuse when performing repeated lifting at work. Pt was also introduced to relaxation techniques to perform between working 2 jobs to [employer] and to minimize pelvic floor overactivity. Pt is close to d/c with skilled PT      Rehab Potential  Good    PT Frequency   1x / week    PT Duration  12 weeks    PT Treatment/Interventions  Therapeutic activities;Therapeutic exercise;Functional mobility training;Neuromuscular re-education;Manual techniques;Cryotherapy;Moist Heat;Splinting;Taping;Scar mobilization;Gait training;Stair training;Patient/family education    Consulted and Agree with Plan of Care  Patient       Patient will benefit from skilled therapeutic intervention in order to improve the following deficits and impairments:  Postural dysfunction, Increased muscle spasms, Hypermobility, Improper body mechanics, Decreased coordination, Decreased mobility, Decreased safety awareness, Difficulty walking, Decreased endurance, Decreased activity tolerance, Decreased range of motion, Decreased strength, Hypomobility  Visit Diagnosis: Other lack of coordination  Other symptoms and signs involving the musculoskeletal system  Myalgia     Problem List Patient Active Problem List   Diagnosis Date Noted  . Hyperlipidemia, unspecified 09/29/2017    Jerl Mina ,PT, DPT, E-RYT  11/02/2017, 4:47 PM  [place] MAIN [facility] SERVICES [address] [place], [place], [address] Phone: [phone]   Fax:  [phone]  Name: Samantha Tran MRN: [id number] Date of Birth: [birth year]

## 2017-11-17 ENCOUNTER — Ambulatory Visit: Payer: BC Managed Care – PPO | Admitting: Physical Therapy

## 2017-12-01 ENCOUNTER — Ambulatory Visit: Payer: BC Managed Care – PPO | Attending: Urology | Admitting: Physical Therapy

## 2017-12-01 DIAGNOSIS — M791 Myalgia, unspecified site: Secondary | ICD-10-CM | POA: Insufficient documentation

## 2017-12-01 DIAGNOSIS — R29898 Other symptoms and signs involving the musculoskeletal system: Secondary | ICD-10-CM | POA: Insufficient documentation

## 2017-12-01 DIAGNOSIS — R278 Other lack of coordination: Secondary | ICD-10-CM | POA: Insufficient documentation

## 2017-12-02 NOTE — Therapy (Signed)
[facility] [address] [place], [place], [address] Phone: [phone]   Fax:  [phone]  Patient Details  Name: Bary Richardnnette H Curran MRN: [id number] Date of Birth: 1961-06-26 Referring Provider:  Hulan FrayMcGowan, Shannon A, PA-C  Encounter Date: 12/01/2017   Pt reported she hurt her midback yesterday at work. Pt has been doing the tasks for two people because her team is short staffed. Pt's pain is located in the midback and does not radiate. Pt has seen her PCP who has asked her to get an Xray.  "It is a solid hurt" at a 10/10 . If she bends forward, the pain eases to 8/10.  The pain is constant and feels like a "solid knot of hurt".    R area at the level of T10-12 was more raised compared to L upon assessment. Withheld PT today. [facility] Plan to reassess her goals at next visit and possibly d/c as she reports her leakage is 70% better.  Plan to treat her acute thoracic if requested by PCP.     No charges were billed for today's session.   Mariane MastersYeung,Shin Yiing ,PT, DPT, E-RYT  12/02/2017, 8:22 AM   [facility] [address] [place], [place], [address] Phone: [phone]   Fax:  [phone]

## 2017-12-15 ENCOUNTER — Ambulatory Visit: Payer: BC Managed Care – PPO | Admitting: Physical Therapy

## 2018-08-05 ENCOUNTER — Other Ambulatory Visit: Payer: Self-pay | Admitting: Family Medicine

## 2018-08-05 DIAGNOSIS — Z1231 Encounter for screening mammogram for malignant neoplasm of breast: Secondary | ICD-10-CM

## 2018-08-24 ENCOUNTER — Ambulatory Visit
Admission: RE | Admit: 2018-08-24 | Discharge: 2018-08-24 | Disposition: A | Discharge status: Home / Self Care | Payer: BC Managed Care – PPO | Source: Ambulatory Visit | Attending: Family Medicine | Admitting: Family Medicine

## 2018-08-24 DIAGNOSIS — Z1231 Encounter for screening mammogram for malignant neoplasm of breast: Secondary | ICD-10-CM | POA: Diagnosis not present

## 2020-04-10 ENCOUNTER — Other Ambulatory Visit: Payer: Self-pay | Admitting: Family Medicine

## 2020-04-10 DIAGNOSIS — Z1231 Encounter for screening mammogram for malignant neoplasm of breast: Secondary | ICD-10-CM

## 2020-06-03 ENCOUNTER — Ambulatory Visit
Admission: RE | Admit: 2020-06-03 | Discharge: 2020-06-03 | Disposition: A | Discharge status: Home / Self Care | Payer: BC Managed Care – PPO | Source: Ambulatory Visit | Attending: Family Medicine | Admitting: Family Medicine

## 2020-06-03 DIAGNOSIS — Z1231 Encounter for screening mammogram for malignant neoplasm of breast: Secondary | ICD-10-CM | POA: Insufficient documentation

## 2021-09-09 IMAGING — MG DIGITAL SCREENING BILAT W/ TOMO W/ CAD
6 of 12 series · 6 of 36 positions shown · non-contrast
Comparison: Previous exam(s).

CLINICAL DATA: Screening.

EXAM:
DIGITAL SCREENING BILATERAL MAMMOGRAM WITH TOMO AND CAD

[L CC synth-2D (1 of 2)]
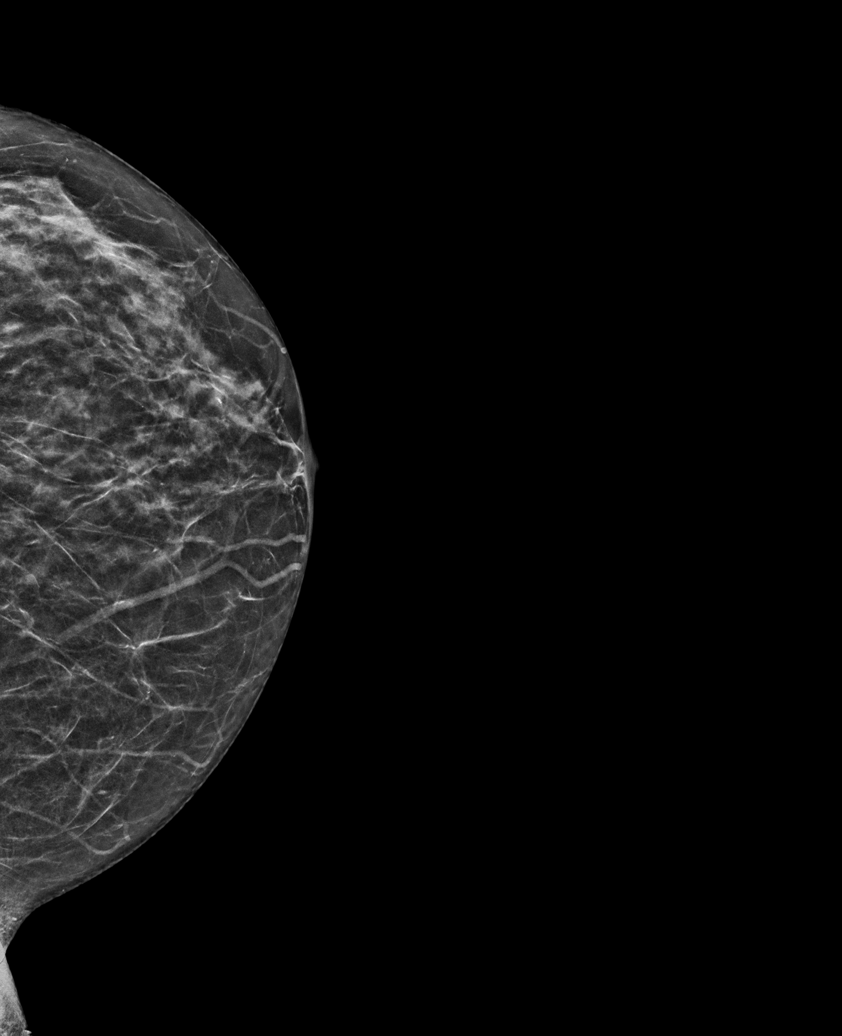

[R MLO synth-2D (1 of 2)]
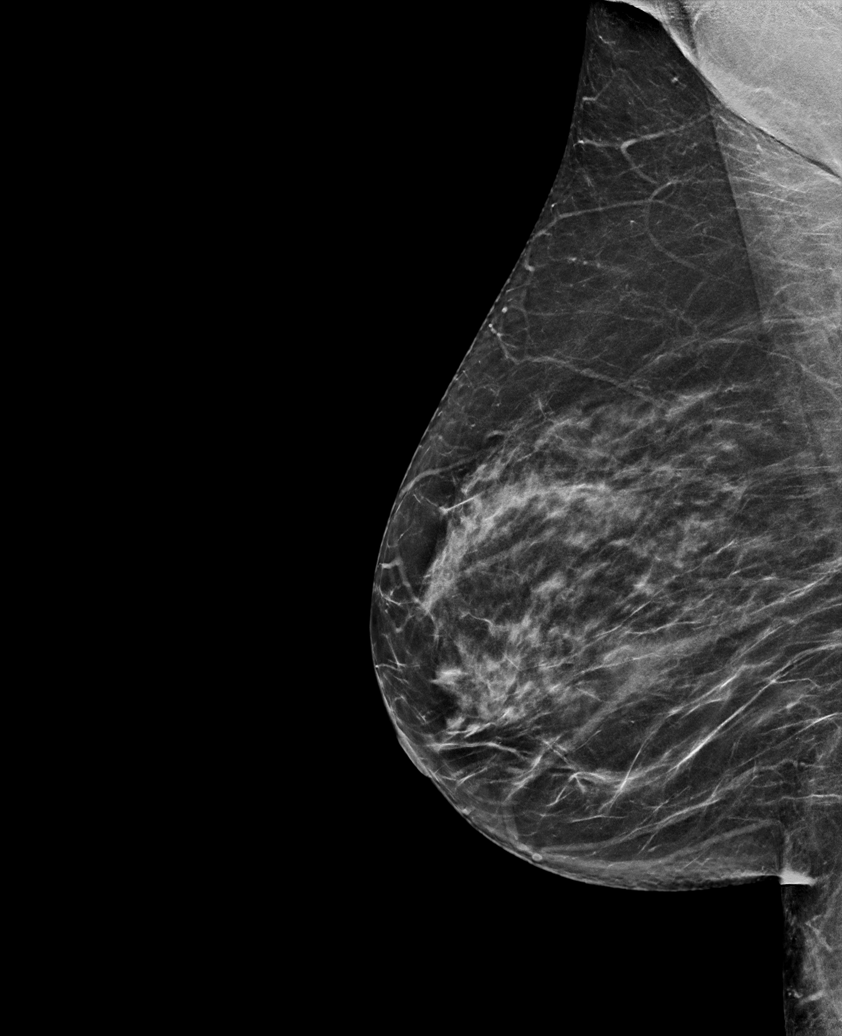

[R CC synth-2D]
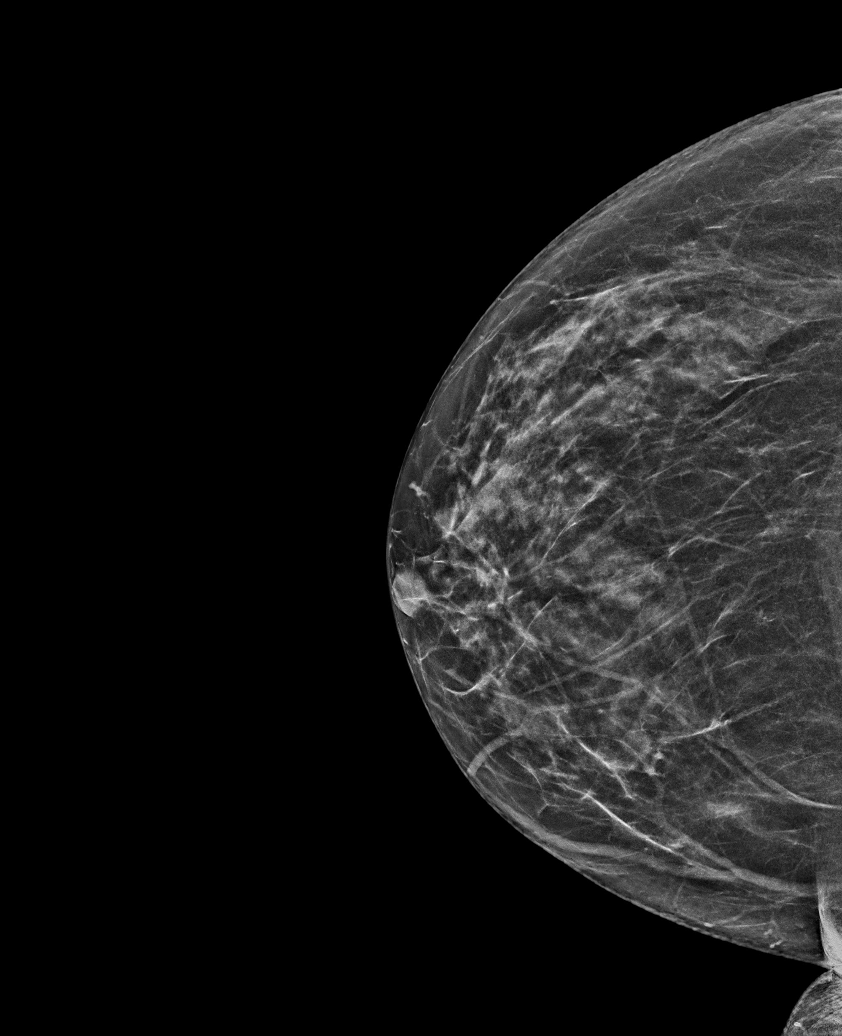

[L MLO synth-2D]
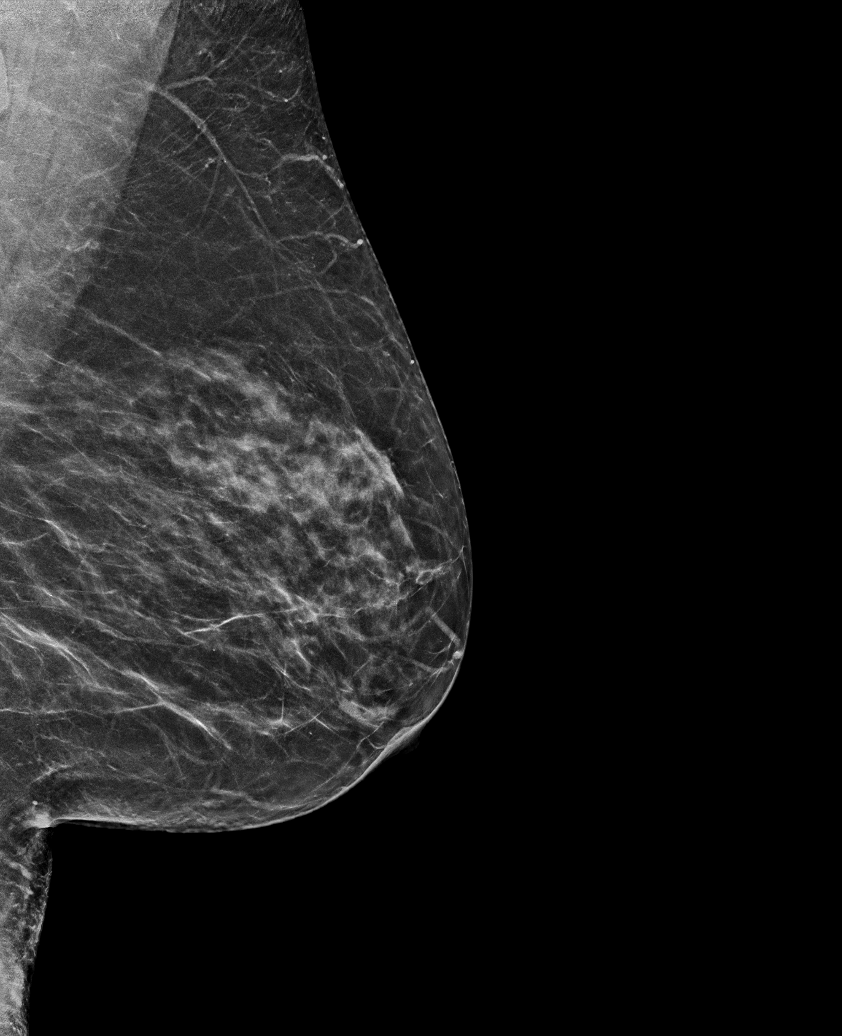

[L CC synth-2D (2 of 2)]
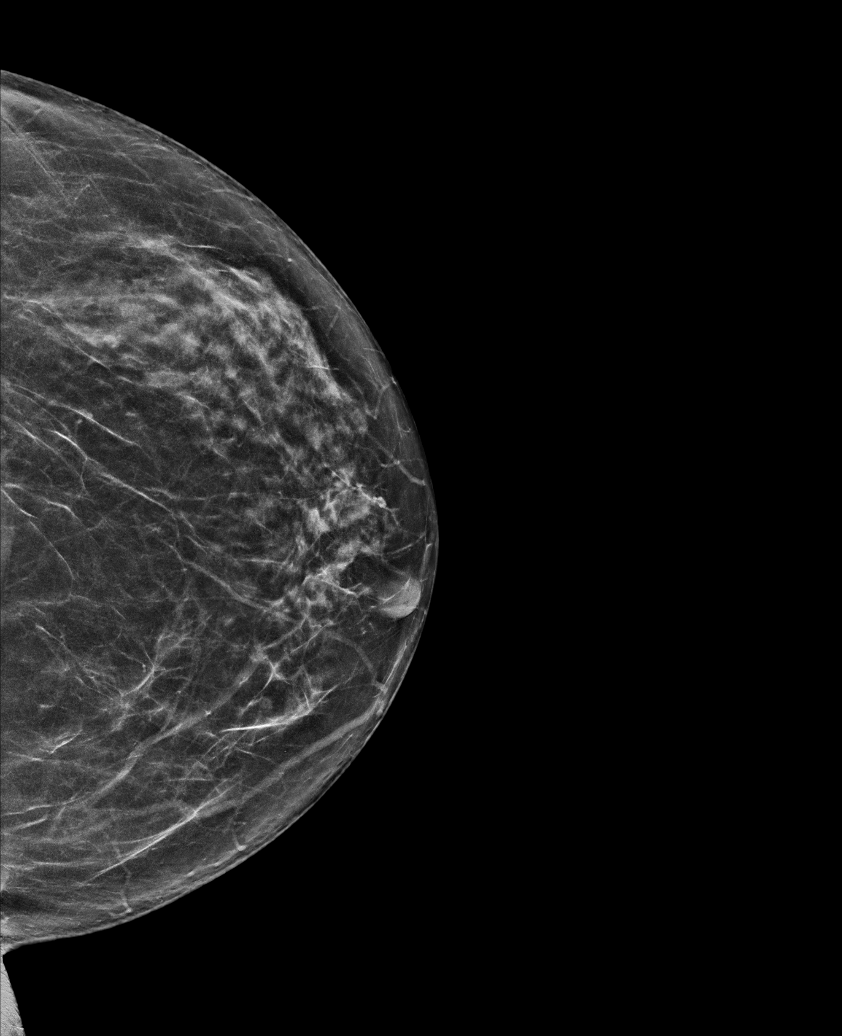

[R MLO synth-2D (2 of 2)]
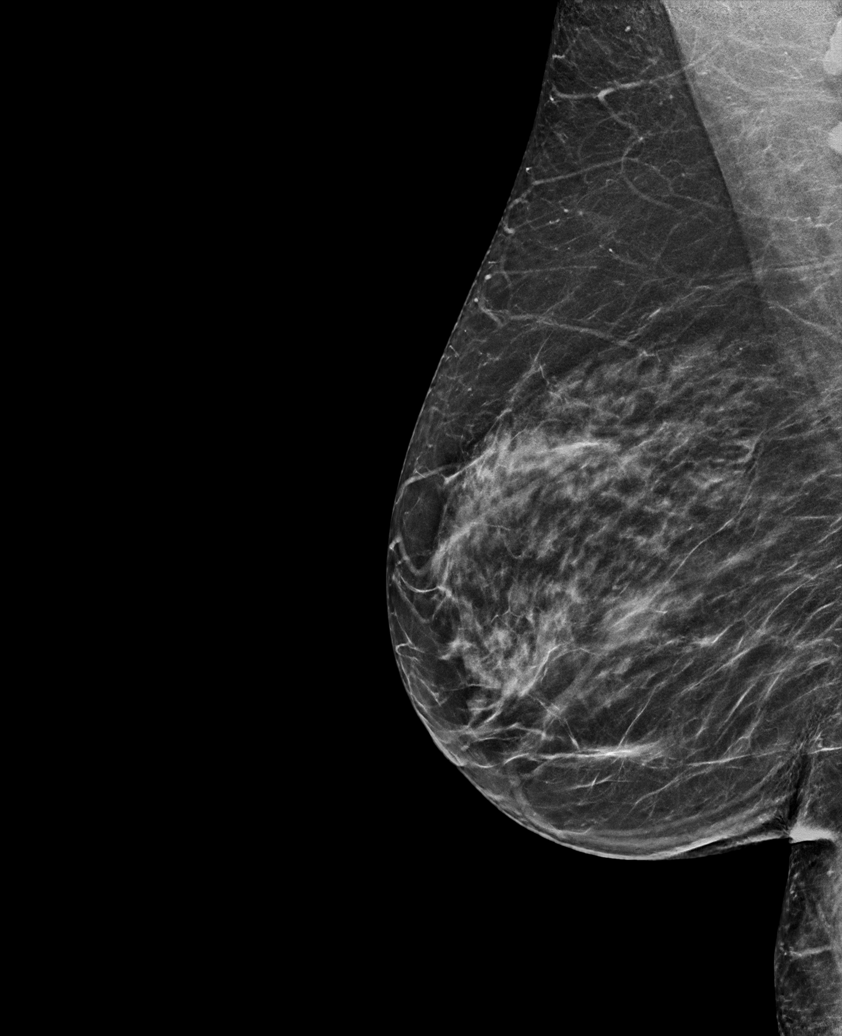

[6 of 36 positions shown; findings below may reference images not displayed]

ACR Breast Density Category c: The breast tissue is heterogeneously
dense, which may obscure small masses.
FINDINGS: There are no findings suspicious for malignancy. Images were
processed with CAD.
IMPRESSION: No mammographic evidence of malignancy. A result letter of this
screening mammogram will be mailed directly to the patient.

RECOMMENDATION:
Screening mammogram in one year. (Code:[id number])

BI-RADS CATEGORY  1: Negative.

## 2023-01-20 ENCOUNTER — Other Ambulatory Visit: Payer: Self-pay | Admitting: Family Medicine

## 2023-01-20 DIAGNOSIS — Z1231 Encounter for screening mammogram for malignant neoplasm of breast: Secondary | ICD-10-CM

## 2023-02-11 ENCOUNTER — Inpatient Hospital Stay: Admission: RE | Admit: 2023-02-11 | Payer: BC Managed Care – PPO | Source: Ambulatory Visit

## 2023-06-28 ENCOUNTER — Emergency Department
Admission: EM | Admit: 2023-06-28 | Discharge: 2023-06-28 | Disposition: A | Discharge status: Home / Self Care | Payer: BC Managed Care – PPO | Attending: Emergency Medicine | Admitting: Emergency Medicine

## 2023-06-28 ENCOUNTER — Other Ambulatory Visit: Payer: Self-pay

## 2023-06-28 ENCOUNTER — Emergency Department: Payer: BC Managed Care – PPO

## 2023-06-28 DIAGNOSIS — K55069 Acute infarction of intestine, part and extent unspecified: Secondary | ICD-10-CM | POA: Insufficient documentation

## 2023-06-28 DIAGNOSIS — D72829 Elevated white blood cell count, unspecified: Secondary | ICD-10-CM | POA: Insufficient documentation

## 2023-06-28 DIAGNOSIS — R109 Unspecified abdominal pain: Secondary | ICD-10-CM | POA: Diagnosis present

## 2023-06-28 LAB — COMPREHENSIVE METABOLIC PANEL
ALT: 23 U/L (ref 0–44)
AST: 17 U/L (ref 15–41)
Albumin: 3.9 g/dL (ref 3.5–5.0)
Alkaline Phosphatase: 89 U/L (ref 38–126)
Anion gap: 8 (ref 5–15)
BUN: 10 mg/dL (ref 8–23)
CO2: 26 mmol/L (ref 22–32)
Calcium: 8.8 mg/dL — ABNORMAL LOW (ref 8.9–10.3)
Chloride: 105 mmol/L (ref 98–111)
Creatinine, Ser: 0.57 mg/dL (ref 0.44–1.00)
GFR, Estimated: >60 mL/min (ref >60)
Glucose, Bld: 106 mg/dL — ABNORMAL HIGH (ref 70–99)
Potassium: 3.9 mmol/L (ref 3.5–5.1)
Sodium: 139 mmol/L (ref 135–145)
Total Bilirubin: 0.6 mg/dL (ref 0.3–1.2)
Total Protein: 7.3 g/dL (ref 6.5–8.1)

## 2023-06-28 LAB — URINALYSIS, ROUTINE W REFLEX MICROSCOPIC
Bilirubin Urine: NEGATIVE
Glucose, UA: NEGATIVE mg/dL
Hgb urine dipstick: NEGATIVE
Ketones, ur: NEGATIVE mg/dL
Leukocytes,Ua: NEGATIVE
Nitrite: NEGATIVE
Protein, ur: NEGATIVE mg/dL
Specific Gravity, Urine: 1.017 (ref 1.005–1.030)
pH: 6 (ref 5.0–8.0)

## 2023-06-28 LAB — CBC
HCT: 40.5 % (ref 36.0–46.0)
Hemoglobin: 13.1 g/dL (ref 12.0–15.0)
MCH: 28.2 pg (ref 26.0–34.0)
MCHC: 32.3 g/dL (ref 30.0–36.0)
MCV: 87.3 fL (ref 80.0–100.0)
Platelets: 261 10*3/uL (ref 150–400)
RBC: 4.64 MIL/uL (ref 3.87–5.11)
RDW: 13.1 % (ref 11.5–15.5)
WBC: 11 10*3/uL — ABNORMAL HIGH (ref 4.0–10.5)
nRBC: 0 % (ref 0.0–0.2)

## 2023-06-28 LAB — LIPASE, BLOOD: Lipase: 26 U/L (ref 11–51)

## 2023-06-28 MED ORDER — ONDANSETRON HCL 4 MG/2ML IJ SOLN
4.0000 mg | Freq: Once | INTRAMUSCULAR | Status: AC
  Administered 2023-06-28: 4 mg via INTRAVENOUS
  Filled 2023-06-28: qty 2

## 2023-06-28 MED ORDER — IOHEXOL 300 MG/ML  SOLN
100.0000 mL | Freq: Once | INTRAMUSCULAR | Status: AC | PRN
  Administered 2023-06-28: 100 mL via INTRAVENOUS

## 2023-06-28 MED ORDER — KETOROLAC TROMETHAMINE 30 MG/ML IJ SOLN
30.0000 mg | Freq: Once | INTRAMUSCULAR | Status: AC
  Administered 2023-06-28: 30 mg via INTRAVENOUS
  Filled 2023-06-28: qty 1

## 2023-06-28 MED ORDER — TRAMADOL HCL 50 MG PO TABS: 50.0000 mg | ORAL_TABLET | Freq: Four times a day (QID) | ORAL | 0 refills | Status: AC | PRN

## 2023-06-28 NOTE — ED Provider Notes (Signed)
[facility] Provider Note    Event Date/Time   First MD Initiated Contact with Patient 06/28/23 1057     (approximate)   History   Abdominal Pain   HPI  Samantha Tran is a 62 y.o. female with a history of cholecystectomy who presents with right-sided abdominal pain.  Patient describes this pain has been ongoing for 2 days, worse with movement.  Normal stools.  No dysuria.  No hematuria.  No history of kidney stones.     Physical Exam   Triage Vital Signs: ED Triage Vitals  Enc Vitals Group     BP 06/28/23 1044 (!) 171/81     Pulse Rate 06/28/23 1044 89     Resp 06/28/23 1044 18     Temp 06/28/23 1043 98.2 F (36.8 C)     Temp src --      SpO2 06/28/23 1044 97 %     Weight 06/28/23 1043 89.4 kg (197 lb)     Height 06/28/23 1043 1.549 m (5\' 1" )     Head Circumference --      Peak Flow --      Pain Score 06/28/23 1043 10     Pain Loc --      Pain Edu? --      Excl. in GC? --     Most recent vital signs: Vitals:   06/28/23 1043 06/28/23 1044  BP:  (!) 171/81  Pulse:  89  Resp:  18  Temp: 98.2 F (36.8 C)   SpO2:  97%     General: Awake, no distress.  CV:  Good peripheral perfusion.  Resp:  Normal effort.  Abd:  No distention.  Other:     ED Results / Procedures / Treatments   Labs (all labs ordered are listed, but only abnormal results are displayed) Labs Reviewed  COMPREHENSIVE METABOLIC PANEL - Abnormal; Notable for the following components:      Result Value   Glucose, Bld 106 (*)    Calcium 8.8 (*)    All other components within normal limits  CBC - Abnormal; Notable for the following components:   WBC 11.0 (*)    All other components within normal limits  URINALYSIS, ROUTINE W REFLEX MICROSCOPIC - Abnormal; Notable for the following components:   Color, Urine YELLOW (*)    APPearance CLEAR (*)    All other components within normal limits  LIPASE, BLOOD     EKG     RADIOLOGY CT abdomen  pelvis    PROCEDURES:  Critical Care performed:   Procedures   MEDICATIONS ORDERED IN ED: Medications  ketorolac (TORADOL) 30 MG/ML injection 30 mg (30 mg Intravenous Given 06/28/23 1207)  ondansetron (ZOFRAN) injection 4 mg (4 mg Intravenous Given 06/28/23 1206)  iohexol (OMNIPAQUE) 300 MG/ML solution 100 mL (100 mLs Intravenous Contrast Given 06/28/23 1142)     IMPRESSION / MDM / ASSESSMENT AND PLAN / ED COURSE  I reviewed the triage vital signs and the nursing notes. Patient's presentation is most consistent with acute presentation with potential threat to life or bodily function.  Patient presents with abdominal pain x 3 days, right-sided.  Has a history of cholecystectomy.  Differential includes appendicitis, colitis, ureterolithiasis.  Pending labs, will treat with IV Toradol and IV Zofran as the patient did drive today.  Lab work demonstrates mild elevation of white blood cell count, otherwise reassuring labs  CT scan is consistent with omental infarction.  Patient has no diarrhea to  suggest colitis.  Will treat with analgesics, outpatient follow-up and return precautions discussed      FINAL CLINICAL IMPRESSION(S) / ED DIAGNOSES   Final diagnoses:  Omental infarction (HCC)     Rx / DC Orders   ED Discharge Orders          Ordered    traMADol (ULTRAM) 50 MG tablet  Every 6 hours PRN        06/28/23 1255             Note:  This document was prepared using Dragon voice recognition software and may include unintentional dictation errors.   Jene Every, MD 06/28/23 1256

## 2023-06-28 NOTE — Discharge Instructions (Signed)
You have an omental infarction which is a benign self-limited condition which should improve over the next several days with pain medication, if symptoms worsen please return to the emergency department

## 2023-06-28 NOTE — ED Triage Notes (Signed)
Pt to ED for right sided abd pain started Friday. Denies n/v/d. Pain worsens when lying flat.

## 2024-10-27 ENCOUNTER — Other Ambulatory Visit: Payer: Self-pay | Admitting: Family Medicine

## 2024-10-27 DIAGNOSIS — Z1231 Encounter for screening mammogram for malignant neoplasm of breast: Secondary | ICD-10-CM

## 2024-12-08 ENCOUNTER — Encounter: Payer: Self-pay | Admitting: Gastroenterology

## 2024-12-08 ENCOUNTER — Encounter: Admission: RE | Disposition: A | Payer: Self-pay | Source: Home / Self Care | Attending: Gastroenterology

## 2024-12-08 ENCOUNTER — Ambulatory Visit
Admission: RE | Admit: 2024-12-08 | Discharge: 2024-12-08 | Disposition: A | Attending: Gastroenterology | Admitting: Gastroenterology

## 2024-12-08 ENCOUNTER — Ambulatory Visit: Admitting: Certified Registered"

## 2024-12-08 DIAGNOSIS — D120 Benign neoplasm of cecum: Secondary | ICD-10-CM | POA: Diagnosis not present

## 2024-12-08 DIAGNOSIS — Z1211 Encounter for screening for malignant neoplasm of colon: Secondary | ICD-10-CM | POA: Diagnosis present

## 2024-12-08 DIAGNOSIS — K5730 Diverticulosis of large intestine without perforation or abscess without bleeding: Secondary | ICD-10-CM | POA: Diagnosis not present

## 2024-12-08 HISTORY — PX: COLONOSCOPY: SHX5424

## 2024-12-08 HISTORY — PX: HEMOSTASIS CLIP PLACEMENT: SHX6857

## 2024-12-08 HISTORY — PX: POLYPECTOMY: SHX149

## 2024-12-08 SURGERY — COLONOSCOPY
Anesthesia: General

## 2024-12-08 MED ORDER — PROPOFOL 500 MG/50ML IV EMUL
INTRAVENOUS | Status: DC | PRN
  Administered 2024-12-08: 120 ug/kg/min via INTRAVENOUS

## 2024-12-08 MED ORDER — PROPOFOL 10 MG/ML IV BOLUS
INTRAVENOUS | Status: DC | PRN
  Administered 2024-12-08: 30 mg via INTRAVENOUS
  Administered 2024-12-08: 70 mg via INTRAVENOUS

## 2024-12-08 MED ORDER — SODIUM CHLORIDE 0.9 % IV SOLN
INTRAVENOUS | Status: DC
  Administered 2024-12-08: 20 mL/h via INTRAVENOUS

## 2024-12-08 MED ORDER — LIDOCAINE 2% (20 MG/ML) 5 ML SYRINGE
INTRAMUSCULAR | Status: DC | PRN
  Administered 2024-12-08: 20 mg via INTRAVENOUS

## 2024-12-08 NOTE — Anesthesia Preprocedure Evaluation (Signed)
 Anesthesia Evaluation  Patient identified by MRN, date of birth, ID band Patient awake    Reviewed: Allergy & Precautions, H&P , NPO status , Patient's Chart, lab work & pertinent test results, reviewed documented beta blocker date and time   History of Anesthesia Complications Negative for: history of anesthetic complications  Airway Mallampati: II  TM Distance: >3 FB Neck ROM: full    Dental  (+) Teeth Intact, Dental Advidsory Given   Pulmonary neg pulmonary ROS   Pulmonary exam normal breath sounds clear to auscultation       Cardiovascular Exercise Tolerance: Good negative cardio ROS Normal cardiovascular exam Rhythm:regular Rate:Normal     Neuro/Psych negative neurological ROS  negative psych ROS   GI/Hepatic negative GI ROS, Neg liver ROS,,,  Endo/Other  negative endocrine ROS    Renal/GU negative Renal ROS  negative genitourinary   Musculoskeletal   Abdominal   Peds  Hematology negative hematology ROS (+)   Anesthesia Other Findings Past Medical History: No date: HLD (hyperlipidemia)   Reproductive/Obstetrics negative OB ROS                              Anesthesia Physical Anesthesia Plan  ASA: 2  Anesthesia Plan: General   Post-op Pain Management:    Induction: Intravenous  PONV Risk Score and Plan: 3 and Propofol infusion and TIVA  Airway Management Planned: Natural Airway and Nasal Cannula  Additional Equipment:   Intra-op Plan:   Post-operative Plan:   Informed Consent: I have reviewed the patients History and Physical, chart, labs and discussed the procedure including the risks, benefits and alternatives for the proposed anesthesia with the patient or authorized representative who has indicated his/her understanding and acceptance.     Dental Advisory Given  Plan Discussed with: Anesthesiologist, CRNA and Surgeon  Anesthesia Plan Comments:           Anesthesia Quick Evaluation

## 2024-12-08 NOTE — Transfer of Care (Signed)
 Immediate Anesthesia Transfer of Care Note  Patient: Lenward VEAR Harari  Procedure(s) Performed: COLONOSCOPY CONTROL OF HEMORRHAGE, GI TRACT, ENDOSCOPIC, BY CLIPPING OR OVERSEWING POLYPECTOMY, INTESTINE  Patient Location: Endoscopy Unit  Anesthesia Type:General  Level of Consciousness: awake, alert , and oriented  Airway & Oxygen Therapy: Patient Spontanous Breathing  Post-op Assessment: Report given to RN and Post -op Vital signs reviewed and stable  Post vital signs: Reviewed  Last Vitals:  Vitals Value Taken Time  BP 97/53 12/08/24 10:19  Temp    Pulse 77 12/08/24 10:18  Resp 17 12/08/24 10:20  SpO2 98 % 12/08/24 10:18  Vitals shown include unfiled device data.  Last Pain:  Vitals:   12/08/24 1018  TempSrc:   PainSc: 0-No pain         Complications: No notable events documented.

## 2024-12-08 NOTE — Op Note (Signed)
 [facility] Patient Name: Samantha Tran Procedure Date: 12/08/2024 9:56 AM MRN: [id number] Account #: [id number] Date of Birth: 1961-10-26 Admit Type: Outpatient Age: 63 Room: [facility] ENDO ROOM 2 Gender: Female Note Status: Finalized Instrument Name: Colon Scope [id number] Procedure:             Colonoscopy Indications:           Surveillance: Personal history of adenomatous polyps                         on last colonoscopy > 3 years ago, Last colonoscopy:                         June 2015 Providers:             Ruel Kung MD, MD Referring MD:          Lynwood FALCON. Valora, MD (Referring MD) Medicines:             Monitored Anesthesia Care Complications:         No immediate complications. Procedure:             Pre-Anesthesia Assessment:                        - Prior to the procedure, a History and Physical was                         performed, and patient medications, allergies and                         sensitivities were reviewed. The patient's tolerance                         of previous anesthesia was reviewed.                        - The risks and benefits of the procedure and the                         sedation options and risks were discussed with the                         patient. All questions were answered and informed                         consent was obtained.                        - ASA Grade Assessment: II - A patient with mild                         systemic disease.                        After obtaining informed consent, the colonoscope was                         passed under direct vision. Throughout the procedure,                         the  patient's blood pressure, pulse, and oxygen                         saturations were monitored continuously. The                         Colonoscope was introduced through the anus and                         advanced to the the cecum, identified by the                          appendiceal orifice. The colonoscopy was performed                         with ease. The patient tolerated the procedure well.                         The quality of the bowel preparation was excellent.                         The ileocecal valve, appendiceal orifice, and rectum                         were photographed. Findings:      The perianal and digital rectal examinations were normal.      Multiple medium-mouthed diverticula were found in the left colon.      Two sessile polyps were found in the cecum. The polyps were 2 to 3 mm in       size. These polyps were removed with a jumbo cold forceps. Resection and       retrieval were complete.      An 8 mm polyp was found in the cecum. The polyp was sessile. The polyp       was removed with a cold snare. Resection and retrieval were complete. To       prevent bleeding post-intervention, one hemostatic clip was successfully       placed. Clip manufacturer: [employer]. There was no bleeding at       the end of the procedure.      The exam was otherwise without abnormality on direct and retroflexion       views. Impression:            - Diverticulosis in the left colon.                        - Two 2 to 3 mm polyps in the cecum, removed with a                         jumbo cold forceps. Resected and retrieved.                        - One 8 mm polyp in the cecum, removed with a cold                         snare. Resected and retrieved. Clip was placed. Clip  manufacturer: [employer].                        - The examination was otherwise normal on direct and                         retroflexion views. Recommendation:        - Discharge patient to home (with escort).                        - Resume previous diet.                        - Continue present medications.                        - Await pathology results.                        - Repeat colonoscopy for surveillance based on                          pathology results. Procedure Code(s):     --- Professional ---                        [phone], Colonoscopy, flexible; with removal of                         tumor(s), polyp(s), or other lesion(s) by snare                         technique                        45380, 59, Colonoscopy, flexible; with biopsy, single                         or multiple Diagnosis Code(s):     --- Professional ---                        D12.0, Benign neoplasm of cecum                        Z86.010, Personal history of colonic polyps                        K57.30, Diverticulosis of large intestine without                         perforation or abscess without bleeding CPT copyright 2022 American Medical Association. All rights reserved. The codes documented in this report are preliminary and upon coder review may  be revised to meet current compliance requirements. Ruel Kung, MD Ruel Kung MD, MD 12/08/2024 10:16:24 AM This report has been signed electronically. Number of Addenda: 0 Note Initiated On: 12/08/2024 9:56 AM Scope Withdrawal Time: 0 hours 7 minutes 45 seconds  Total Procedure Duration: 0 hours 10 minutes 45 seconds  Estimated Blood Loss:  Estimated blood loss: none.      [facility]

## 2024-12-08 NOTE — H&P (Signed)
 Ruel Kung , MD [address], [address], [place], [place], [address] Phone: [phone] Fax: [phone]  Primary Care Physician:  Valora [place] FALCON, MD   Pre-Procedure History & Physical: HPI:  COURTNIE BRENES is a 63 y.o. female is here for an colonoscopy.   Past Medical History:  Diagnosis Date   HLD (hyperlipidemia)     Past Surgical History:  Procedure Laterality Date   CHOLECYSTECTOMY  1992   HERNIA REPAIR  1995   below belly button    Prior to Admission medications  Medication Sig Start Date End Date Taking? Authorizing Provider  chlorpheniramine-HYDROcodone (TUSSIONEX) 10-8 MG/5ML SUER Take by mouth. 09/06/17   [provider]  levofloxacin (LEVAQUIN) 500 MG tablet Take by mouth. 09/06/17   [provider]  mirabegron ER (MYRBETRIQ) 50 MG TB24 tablet Take 50 mg by mouth daily.    [provider]  sertraline (ZOLOFT) 25 MG tablet Take by mouth. 07/22/17 07/22/18  [provider]  simvastatin (ZOCOR) 10 MG tablet Take by mouth.    [provider]  topiramate (TOPAMAX) 100 MG tablet Take by mouth. 07/22/17   [provider]    Allergies as of 12/04/2024 - Review Complete 06/28/2023  Allergen Reaction Noted   Fluoxetine Other (See Comments) 06/19/2014   Oxycodone Other (See Comments) 06/19/2014    Family History  Problem Relation Age of Onset   Kidney cancer Neg Hx    Bladder Cancer Neg Hx    Breast cancer Neg Hx     Social History   Socioeconomic History   Marital status: Legally Separated    Spouse name: Not on file   Number of children: Not on file   Years of education: Not on file   Highest education level: Not on file  Occupational History   Not on file  Tobacco Use   Smoking status: Never   Smokeless tobacco: Never  Vaping Use   Vaping status: Never Used  Substance and Sexual Activity   Alcohol use: No   Drug use: No   Sexual activity: Never  Other Topics Concern   Not on file   Social History Narrative   Not on file   Social Drivers of Health   Tobacco Use: Low Risk  (11/29/2024)   Received from [facility]   Patient History    Smoking Tobacco Use: Never    Smokeless Tobacco Use: Never    Passive Exposure: Not on file  Financial Resource Strain: Low Risk  (10/16/2024)   Received from [facility]   Overall Financial Resource Strain (CARDIA)    Difficulty of Paying Living Expenses: Not hard at all  Food Insecurity: No Food Insecurity (10/16/2024)   Received from [facility]   Epic    Within the past 12 months, you worried that your food would run out before you got the money to buy more.: Never true    Within the past 12 months, the food you bought just didn't last and you didn't have money to get more.: Never true  Transportation Needs: No Transportation Needs (10/16/2024)   Received from [facility] - Transportation    In the past 12 months, has lack of transportation kept you from medical appointments or from getting medications?: No    Lack of Transportation (Non-Medical): No  Physical Activity: Not on file  Stress: Not on file  Social Connections: Not on file  Intimate Partner Violence:  Not on file  Depression (EYV7-0): Not on file  Alcohol Screen: Not on file  Housing: Low Risk  (10/16/2024)   Received from [facility]   Epic    In the last 12 months, was there a time when you were not able to pay the mortgage or rent on time?: No    In the past 12 months, how many times have you moved where you were living?: 0    At any time in the past 12 months, were you homeless or living in a shelter (including now)?: No  Utilities: Not At Risk (10/16/2024)   Received from [facility]   Epic    In the past 12 months has the electric, gas, oil, or water company threatened to shut off services in your home?: No  Health Literacy: Not on file     Review of Systems: See HPI, otherwise negative ROS  Physical Exam: There were no vitals taken for this visit. General:   Alert,  pleasant and cooperative in NAD Head:  Normocephalic and atraumatic. Neck:  Supple; no masses or thyromegaly. Lungs:  Clear throughout to auscultation, normal respiratory effort.    Heart:  +S1, +S2, Regular rate and rhythm, No edema. Abdomen:  Soft, nontender and nondistended. Normal bowel sounds, without guarding, and without rebound.   Neurologic:  Alert and  oriented x4;  grossly normal neurologically.  Impression/Plan: KEARIE MENNEN is here for an colonoscopy to be performed for surveillance due to prior history of colon polyps   Risks, benefits, limitations, and alternatives regarding  colonoscopy have been reviewed with the patient.  Questions have been answered.  All parties agreeable.   Ruel Kung, MD  12/08/2024, 9:22 AM

## 2024-12-11 LAB — SURGICAL PATHOLOGY

## 2024-12-13 ENCOUNTER — Ambulatory Visit: Payer: Self-pay | Admitting: Gastroenterology

## 2024-12-16 NOTE — Anesthesia Postprocedure Evaluation (Signed)
"   Anesthesia Post Note  Patient: Samantha Tran  Procedure(s) Performed: COLONOSCOPY CONTROL OF HEMORRHAGE, GI TRACT, ENDOSCOPIC, BY CLIPPING OR OVERSEWING POLYPECTOMY, INTESTINE  Patient location during evaluation: Endoscopy Anesthesia Type: General Level of consciousness: awake and alert Pain management: pain level controlled Vital Signs Assessment: post-procedure vital signs reviewed and stable Respiratory status: spontaneous breathing, nonlabored ventilation, respiratory function stable and patient connected to nasal cannula oxygen Cardiovascular status: blood pressure returned to baseline and stable Postop Assessment: no apparent nausea or vomiting Anesthetic complications: no   No notable events documented.   Last Vitals:  Vitals:   12/08/24 1029 12/08/24 1037  BP: (!) 95/51 (!) 94/55  Pulse:    Resp: 16 13  Temp:    SpO2: 99% 96%    Last Pain:  Vitals:   12/08/24 1037  TempSrc:   PainSc: 0-No pain                 Prentice Murphy      "

## 2024-12-29 ENCOUNTER — Ambulatory Visit
Admission: RE | Admit: 2024-12-29 | Discharge: 2024-12-29 | Disposition: A | Discharge status: Home / Self Care | Source: Ambulatory Visit | Attending: Family Medicine | Admitting: Family Medicine

## 2024-12-29 DIAGNOSIS — Z1231 Encounter for screening mammogram for malignant neoplasm of breast: Secondary | ICD-10-CM | POA: Diagnosis present

## 2025-01-04 ENCOUNTER — Other Ambulatory Visit: Payer: Self-pay | Admitting: Family Medicine

## 2025-01-04 DIAGNOSIS — R928 Other abnormal and inconclusive findings on diagnostic imaging of breast: Secondary | ICD-10-CM

## 2025-01-11 ENCOUNTER — Ambulatory Visit
Admission: RE | Admit: 2025-01-11 | Discharge: 2025-01-11 | Disposition: A | Discharge status: Home / Self Care | Source: Ambulatory Visit | Attending: Family Medicine | Admitting: Family Medicine

## 2025-01-11 ENCOUNTER — Inpatient Hospital Stay: Admission: RE | Admit: 2025-01-11 | Discharge: 2025-01-11 | Attending: Family Medicine | Admitting: Family Medicine

## 2025-01-11 DIAGNOSIS — R928 Other abnormal and inconclusive findings on diagnostic imaging of breast: Secondary | ICD-10-CM
